# Patient Record
Sex: Male | Born: 1990 | Race: White | Hispanic: No | Marital: Single | Smoking: Never smoker
Health system: Southern US, Community
[De-identification: ages and names within clinical notes are randomized; demographics above are authoritative.]

## PROBLEM LIST (undated history)

## (undated) DIAGNOSIS — F32A Depression, unspecified: Secondary | ICD-10-CM

## (undated) DIAGNOSIS — F329 Major depressive disorder, single episode, unspecified: Secondary | ICD-10-CM

## (undated) DIAGNOSIS — N19 Unspecified kidney failure: Secondary | ICD-10-CM

## (undated) DIAGNOSIS — F419 Anxiety disorder, unspecified: Secondary | ICD-10-CM

## (undated) HISTORY — DX: Major depressive disorder, single episode, unspecified: F32.9

## (undated) HISTORY — DX: Unspecified kidney failure: N19

## (undated) HISTORY — DX: Anxiety disorder, unspecified: F41.9

## (undated) HISTORY — PX: EYE SURGERY: SHX253

## (undated) HISTORY — DX: Depression, unspecified: F32.A

---

## 2009-09-19 ENCOUNTER — Ambulatory Visit (HOSPITAL_COMMUNITY): Admission: RE | Admit: 2009-09-19 | Discharge: 2009-09-19 | Payer: Self-pay | Admitting: Pediatrics

## 2011-11-03 DIAGNOSIS — H9190 Unspecified hearing loss, unspecified ear: Secondary | ICD-10-CM | POA: Insufficient documentation

## 2011-11-03 DIAGNOSIS — Q8781 Alport syndrome: Secondary | ICD-10-CM | POA: Insufficient documentation

## 2011-11-03 DIAGNOSIS — Q909 Down syndrome, unspecified: Secondary | ICD-10-CM | POA: Insufficient documentation

## 2013-05-29 DIAGNOSIS — D649 Anemia, unspecified: Secondary | ICD-10-CM | POA: Insufficient documentation

## 2013-05-29 DIAGNOSIS — E559 Vitamin D deficiency, unspecified: Secondary | ICD-10-CM | POA: Insufficient documentation

## 2014-05-01 DIAGNOSIS — N181 Chronic kidney disease, stage 1: Secondary | ICD-10-CM | POA: Insufficient documentation

## 2014-05-14 DIAGNOSIS — E785 Hyperlipidemia, unspecified: Secondary | ICD-10-CM | POA: Insufficient documentation

## 2014-05-14 DIAGNOSIS — Q909 Down syndrome, unspecified: Secondary | ICD-10-CM | POA: Insufficient documentation

## 2014-05-14 DIAGNOSIS — E7849 Other hyperlipidemia: Secondary | ICD-10-CM | POA: Insufficient documentation

## 2014-05-14 DIAGNOSIS — Z8659 Personal history of other mental and behavioral disorders: Secondary | ICD-10-CM | POA: Insufficient documentation

## 2014-11-19 ENCOUNTER — Ambulatory Visit: Payer: Self-pay | Admitting: Family Medicine

## 2014-12-18 ENCOUNTER — Other Ambulatory Visit: Payer: Self-pay | Admitting: *Deleted

## 2014-12-18 DIAGNOSIS — G238 Other specified degenerative diseases of basal ganglia: Secondary | ICD-10-CM

## 2014-12-18 DIAGNOSIS — IMO0002 Reserved for concepts with insufficient information to code with codable children: Secondary | ICD-10-CM

## 2014-12-18 DIAGNOSIS — G319 Degenerative disease of nervous system, unspecified: Secondary | ICD-10-CM

## 2014-12-21 ENCOUNTER — Ambulatory Visit
Admission: RE | Admit: 2014-12-21 | Discharge: 2014-12-21 | Disposition: A | Discharge status: Home / Self Care | Payer: BC Managed Care – PPO | Source: Ambulatory Visit | Attending: *Deleted | Admitting: *Deleted

## 2014-12-21 DIAGNOSIS — IMO0002 Reserved for concepts with insufficient information to code with codable children: Secondary | ICD-10-CM

## 2014-12-21 DIAGNOSIS — G238 Other specified degenerative diseases of basal ganglia: Secondary | ICD-10-CM

## 2014-12-21 DIAGNOSIS — G319 Degenerative disease of nervous system, unspecified: Secondary | ICD-10-CM

## 2015-11-13 DIAGNOSIS — G4733 Obstructive sleep apnea (adult) (pediatric): Secondary | ICD-10-CM | POA: Insufficient documentation

## 2015-12-13 ENCOUNTER — Ambulatory Visit: Payer: BC Managed Care – PPO | Attending: Internal Medicine

## 2015-12-13 DIAGNOSIS — G4733 Obstructive sleep apnea (adult) (pediatric): Secondary | ICD-10-CM | POA: Insufficient documentation

## 2015-12-13 DIAGNOSIS — R5383 Other fatigue: Secondary | ICD-10-CM | POA: Diagnosis not present

## 2016-05-20 DIAGNOSIS — R809 Proteinuria, unspecified: Secondary | ICD-10-CM | POA: Insufficient documentation

## 2017-01-26 ENCOUNTER — Ambulatory Visit (INDEPENDENT_AMBULATORY_CARE_PROVIDER_SITE_OTHER): Payer: BC Managed Care – PPO | Admitting: Urology

## 2017-01-26 ENCOUNTER — Encounter: Payer: Self-pay | Admitting: Urology

## 2017-01-26 VITALS — BP 122/79 | HR 106 | Ht 68.0 in | Wt 182.6 lb

## 2017-01-26 DIAGNOSIS — N475 Adhesions of prepuce and glans penis: Secondary | ICD-10-CM | POA: Diagnosis not present

## 2017-01-26 NOTE — Progress Notes (Signed)
01/26/2017 10:57 AM   Juan Choi Aug 22, 1991 [id number]  Referring provider: Marisue Ivan, MD [phone] [facility] [address] [facility] [place], [place] [address]  No chief complaint on file.   HPI: Consultation for balanoposthitis. 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 This get irritation a few times a year with redness and swelling. Also, sebum/wax builds up underneath and comes out. Finally, it pulls the penis to the left and causes urine spraying. Caregiver noticed some dark, waxy build up and removed it. The redness has improved. This has happened before. It typically occurs once or twice a year.   He typically voids with a good stream and has no frequency or urgency.   PMH: Past Medical History:  Diagnosis Date  . Anxiety   . Depression   . Kidney failure     Surgical History: Past Surgical History:  Procedure Laterality Date  . EYE SURGERY      Home Medications:  Allergies as of 01/26/2017   No Known Allergies     Medication List       Accurate as of 01/26/17 10:57 AM. Always use your most recent med list.          ARIPiprazole 2 MG tablet Commonly known as:  ABILIFY Take by mouth.   ciclopirox 8 % solution Commonly known as:  PENLAC Apply topically.   lisinopril 20 MG tablet Commonly known as:  PRINIVIL,ZESTRIL Take by mouth.   LORazepam 1 MG tablet Commonly known as:  ATIVAN Take by mouth.   PA FISH OIL 1000 MG Caps Take by mouth.   venlafaxine XR 37.5 MG 24 hr capsule Commonly known as:  EFFEXOR-XR Take by mouth.   Vitamin D 2000 units tablet Take by mouth.       Allergies: No Known Allergies  Family History: Family History  Problem Relation Age of Onset  . Prostate cancer Neg Hx   . Bladder Cancer Neg Hx   . Kidney cancer Neg Hx     Social History:  reports that he has never smoked. He has never used smokeless tobacco. He reports that he does not drink  alcohol or use drugs.  ROS: UROLOGY Frequent Urination?: No Hard to postpone urination?: No Burning/pain with urination?: No Get up at night to urinate?: No Leakage of urine?: No Urine stream starts and stops?: No Trouble starting stream?: No Do you have to strain to urinate?: No Blood in urine?: No Urinary tract infection?: No Sexually transmitted disease?: No Injury to kidneys or bladder?: No Painful intercourse?: No Weak stream?: No Erection problems?: No Penile pain?: Yes  Gastrointestinal Nausea?: No Vomiting?: No Indigestion/heartburn?: No Diarrhea?: No Constipation?: No  Constitutional Fever: No Night sweats?: No Weight loss?: No Fatigue?: No  Skin Skin rash/lesions?: No Itching?: No  Eyes Blurred vision?: No Double vision?: No  Ears/Nose/Throat Sore throat?: No Sinus problems?: No  Hematologic/Lymphatic Swollen glands?: No Easy bruising?: No  Cardiovascular Leg swelling?: No Chest pain?: No  Respiratory Cough?: No Shortness of breath?: No  Endocrine Excessive thirst?: No  Musculoskeletal Back pain?: No Joint pain?: No  Neurological Headaches?: No Dizziness?: No  Psychologic Depression?: No Anxiety?: No  Physical Exam: There were no vitals taken for this visit.  Constitutional:  Alert and oriented, No acute distress. HEENT: [place] AT, moist mucus membranes.  Trachea midline, no masses. Cardiovascular: No clubbing, cyanosis, or edema. Respiratory: Normal respiratory effort, no increased work of breathing. GI: Abdomen is soft, nontender,  nondistended, no abdominal masses GU: No CVA tenderness.  Skin: No rashes, bruises or suspicious lesions. Lymph: No cervical or inguinal adenopathy. Neurologic: Grossly intact, no focal deficits, moving all 4 extremities. Psychiatric: Normal mood and affect. GU: mild glanular hypospadias, foreskin adhesion/skin bridge on left from about 1 o'clock to 3 o'clock overlapping the coronal sulcus creating  a tunnel underneath. Testicles descended bilaterally, somewhat atrophic, palpably normal, no mass.   Laboratory Data: No results found for: WBC, HGB, HCT, MCV, PLT  No results found for: CREATININE  No results found for: PSA  No results found for: TESTOSTERONE  No results found for: HGBA1C  Urinalysis No results found for: COLORURINE, APPEARANCEUR, LABSPEC, PHURINE, GLUCOSEU, HGBUR, BILIRUBINUR, KETONESUR, PROTEINUR, UROBILINOGEN, NITRITE, LEUKOCYTESUR  Pertinent Imaging:   Assessment & Plan:  1) foreskin adhesion - discussed with patient and Dad the nature, r/b/a to surveillance or circ revision. It would be fairly straight forward to remove this skin bridge and close the defect on the foreskin and glans. It could almost be done in office under local, but might be a little too broad.Will set up for OR. They elect to proceed.   There are no diagnoses linked to this encounter.  No Follow-up on file.  Jerilee FieldESKRIDGE, Nzinga Ferran, MD  [facility] [address], [address] [place], [place] [address] [phone]

## 2017-02-01 ENCOUNTER — Telehealth: Payer: Self-pay | Admitting: Radiology

## 2017-02-01 NOTE — Telephone Encounter (Signed)
sw pt's mother who states they would like to wait for now on surgery. She will call back to schedule circumcision revision at another time.

## 2019-04-05 ENCOUNTER — Other Ambulatory Visit: Payer: Self-pay | Admitting: Sports Medicine

## 2019-04-05 DIAGNOSIS — M25462 Effusion, left knee: Secondary | ICD-10-CM

## 2019-04-05 DIAGNOSIS — S8992XA Unspecified injury of left lower leg, initial encounter: Secondary | ICD-10-CM

## 2019-04-13 ENCOUNTER — Ambulatory Visit
Admission: RE | Admit: 2019-04-13 | Discharge: 2019-04-13 | Disposition: A | Discharge status: Home / Self Care | Payer: BC Managed Care – PPO | Source: Ambulatory Visit | Attending: Sports Medicine | Admitting: Sports Medicine

## 2019-04-13 ENCOUNTER — Other Ambulatory Visit: Payer: Self-pay

## 2019-04-13 DIAGNOSIS — M25462 Effusion, left knee: Secondary | ICD-10-CM

## 2019-04-13 DIAGNOSIS — S8992XA Unspecified injury of left lower leg, initial encounter: Secondary | ICD-10-CM

## 2020-05-19 IMAGING — MR MRI OF THE LEFT KNEE WITHOUT CONTRAST
6 series · 40 of 40 positions shown · non-contrast
Comparison: None.

CLINICAL DATA: Left knee pain.

EXAM:
MRI OF THE LEFT KNEE WITHOUT CONTRAST
TECHNIQUE: Multiplanar, multisequence MR imaging of the knee was performed. No
intravenous contrast was administered.

[Series 8: T2 fat-sat · axial · left · 4.0mm · 0.50mm/px · z∈[-97,+28]mm · 5 of 26 slices shown (1 of 3)]
[im 1/26]
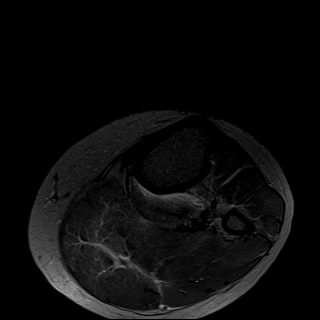
[im 7/26]
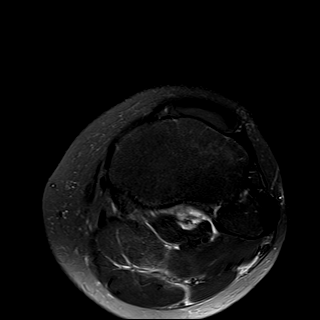
[im 13/26]
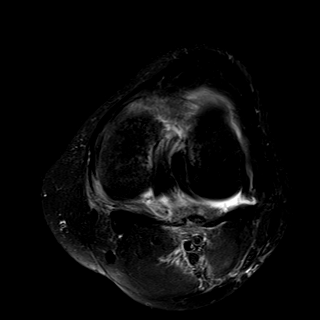
[im 19/26]
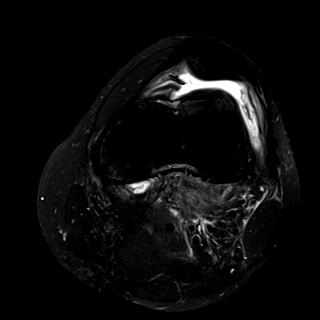
[im 26/26]
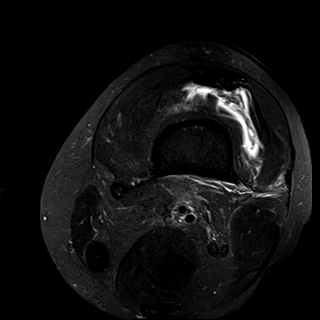

[Series 9: T1 · coronal · left · 4.0mm · 0.59mm/px · 7 of 30 slices shown]
[im 1/30]
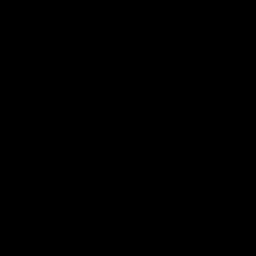
[im 5/30]
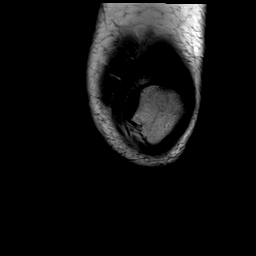
[im 10/30]
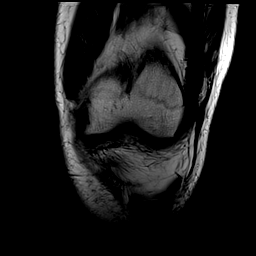
[im 15/30]
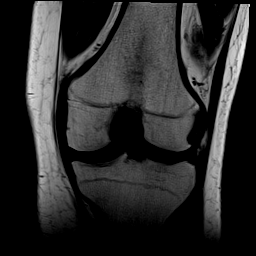
[im 20/30]
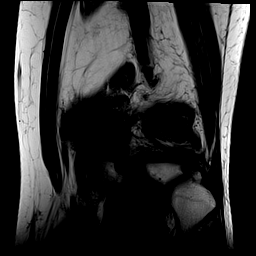
[im 25/30]
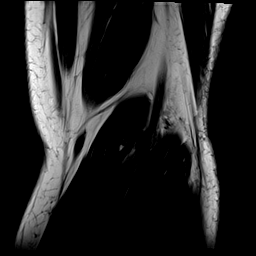
[im 30/30]
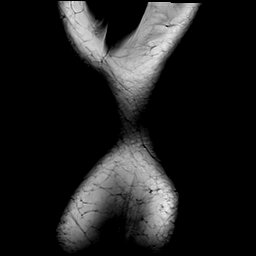

[Series 10: T2 fat-sat · coronal · left · 4.0mm · 0.59mm/px · 7 of 30 slices shown (2 of 3)]
[im 1/30]
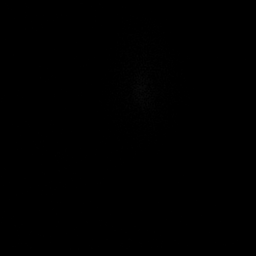
[im 5/30]
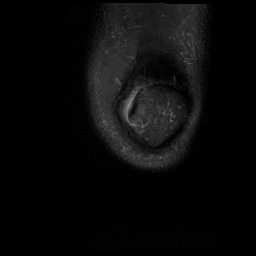
[im 10/30]
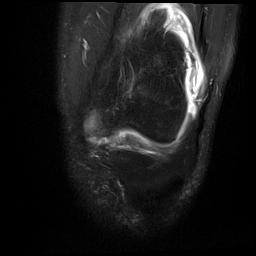
[im 15/30]
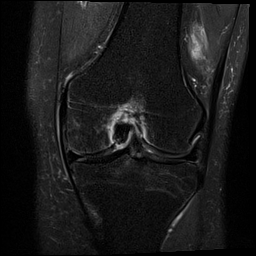
[im 20/30]
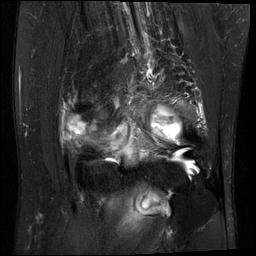
[im 25/30]
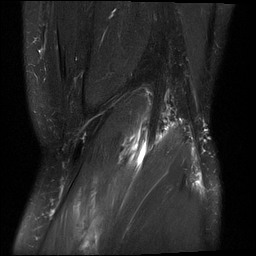
[im 30/30]
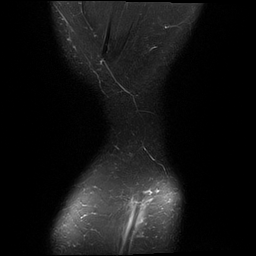

[Series 11: PD fat-sat · coronal · left · 4.0mm · 0.59mm/px · 7 of 30 slices shown (1 of 2)]
[im 1/30]
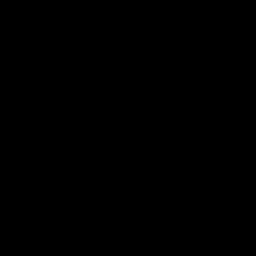
[im 5/30]
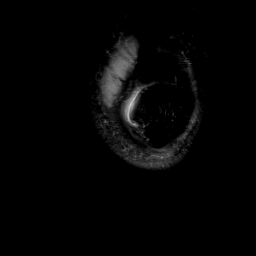
[im 10/30]
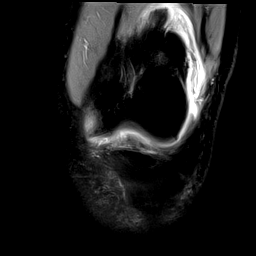
[im 15/30]
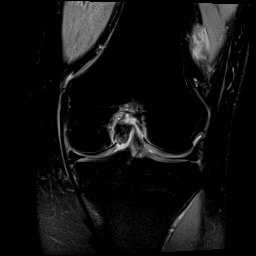
[im 20/30]
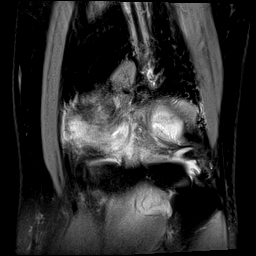
[im 25/30]
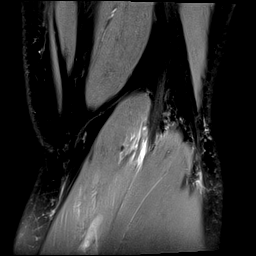
[im 30/30]
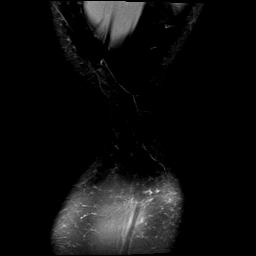

[Series 12: PD fat-sat · sagittal · left · 3.0mm · 0.59mm/px · 7 of 30 slices shown (2 of 2)]
[im 1/30]
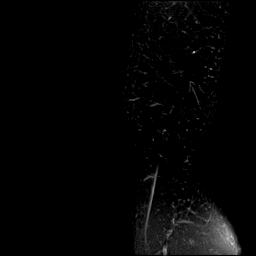
[im 5/30]
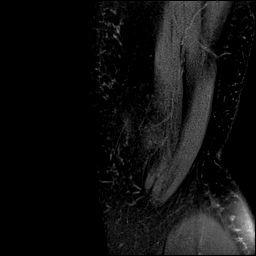
[im 10/30]
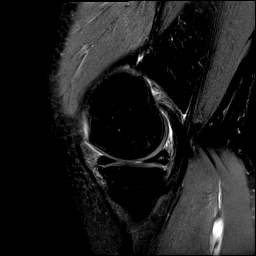
[im 15/30]
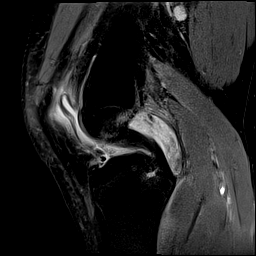
[im 20/30]
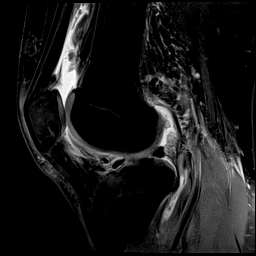
[im 25/30]
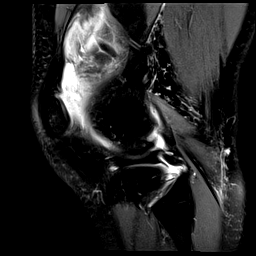
[im 30/30]
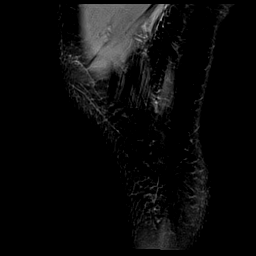

[Series 13: T2 fat-sat · sagittal · left · 3.0mm · 0.59mm/px · 7 of 30 slices shown (3 of 3)]
[im 1/30]
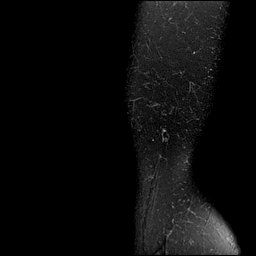
[im 5/30]
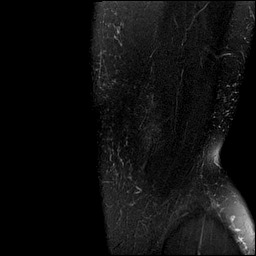
[im 10/30]
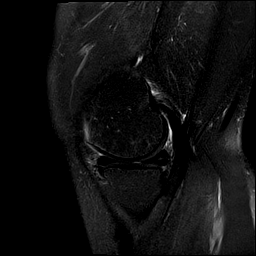
[im 15/30]
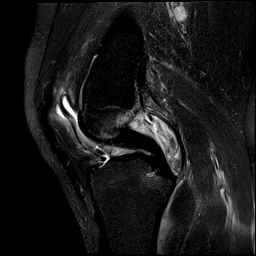
[im 20/30]
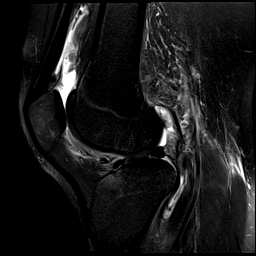
[im 25/30]
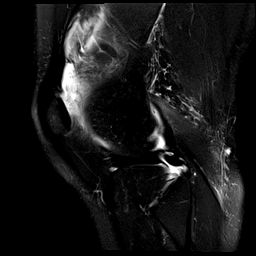
[im 30/30]
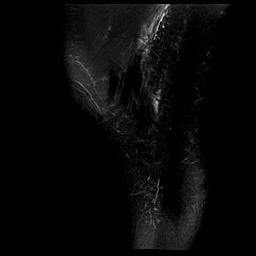

[40 of 40 positions shown; findings below may reference images not displayed]

FINDINGS: MENISCI

Medial meniscus: Increased signal in the posterior horn of the
medial meniscus consistent with mild degeneration. No discrete
meniscal tear.

Lateral meniscus:  Intact.

LIGAMENTS

Cruciates: Intact ACL with mild increased signal consistent with
mild mucinous degeneration. Intact PCL.

Collaterals: Medial collateral ligament is intact. Lateral
collateral ligament complex is intact.

CARTILAGE

Patellofemoral:  Chondromalacia of the lateral patellar facet.

Medial:  No chondral defect.

Lateral:  No chondral defect.

Joint: Large joint effusion with synovitis in the suprapatellar
joint space and posterior joint space just posterior to the PCL. The
more posterior area of synovitis posterior to the PCL at a nodular
appearance measuring approximately 1.6 x 3.9 cm and areas of low
signal likely reflecting nodular synovitis versus less likely PVNS.
Thickened suprapatellar and medial patellar plica. Minimal edema in
Hoffa's fat.

Popliteal Fossa: No Baker's cyst. Mild fluid signal superficial to
the medial gastrocnemius muscle. Intact popliteus tendon.

Extensor Mechanism: Intact quadriceps tendon. Intact patellar
tendon. Intact medial patellar retinaculum. Intact lateral patellar
retinaculum. Intact MPFL. Lateral patellar tilting. TT-TG distance
16 mm

Bones: No acute osseous abnormality. Mild subcortical marrow edema
at the ACL origin and insertion. No aggressive osseous lesion.

Other: No fluid collection or hematoma.  Muscles are normal.
IMPRESSION: 1. Large joint effusion with synovitis in the suprapatellar joint
space and posterior joint space just posterior to the PCL. The more
posterior area of synovitis posterior to the PCL at a nodular
appearance measuring approximately 1.6 x 3.9 cm and areas of low
signal likely reflecting nodular synovitis versus less likely PVNS.
2. Intact ACL with mild increased signal consistent with mild
mucinous degeneration.
